# Patient Record
Sex: Male | Born: 1990 | Race: White | Hispanic: Yes | Marital: Single | Smoking: Never smoker
Health system: Southern US, Community
[De-identification: ages and names within clinical notes are randomized; demographics above are authoritative.]

## PROBLEM LIST (undated history)

## (undated) HISTORY — PX: TONSILLECTOMY: SUR1361

---

## 2008-11-14 ENCOUNTER — Ambulatory Visit: Payer: Self-pay | Admitting: Diagnostic Radiology

## 2008-11-14 ENCOUNTER — Emergency Department (HOSPITAL_BASED_OUTPATIENT_CLINIC_OR_DEPARTMENT_OTHER): Admission: EM | Admit: 2008-11-14 | Discharge: 2008-11-14 | Payer: Self-pay | Admitting: Emergency Medicine

## 2009-01-01 ENCOUNTER — Ambulatory Visit: Payer: Self-pay | Admitting: Diagnostic Radiology

## 2009-01-01 ENCOUNTER — Emergency Department (HOSPITAL_BASED_OUTPATIENT_CLINIC_OR_DEPARTMENT_OTHER): Admission: EM | Admit: 2009-01-01 | Discharge: 2009-01-01 | Payer: Self-pay | Admitting: Emergency Medicine

## 2009-07-12 ENCOUNTER — Emergency Department (HOSPITAL_BASED_OUTPATIENT_CLINIC_OR_DEPARTMENT_OTHER): Admission: EM | Admit: 2009-07-12 | Discharge: 2009-07-12 | Payer: Self-pay | Admitting: Emergency Medicine

## 2009-12-14 ENCOUNTER — Ambulatory Visit: Payer: Self-pay | Admitting: Diagnostic Radiology

## 2009-12-14 ENCOUNTER — Emergency Department (HOSPITAL_BASED_OUTPATIENT_CLINIC_OR_DEPARTMENT_OTHER): Admission: EM | Admit: 2009-12-14 | Discharge: 2009-12-14 | Payer: Self-pay | Admitting: Emergency Medicine

## 2010-01-23 ENCOUNTER — Emergency Department (HOSPITAL_BASED_OUTPATIENT_CLINIC_OR_DEPARTMENT_OTHER): Admission: EM | Admit: 2010-01-23 | Discharge: 2010-01-23 | Payer: Self-pay | Admitting: Emergency Medicine

## 2010-04-28 IMAGING — CR DG CHEST 2V
2 series · 2 of 2 positions shown · non-contrast
Comparison: None

CLINICAL DATA: Cough, back pain, headache.

CHEST - 2 VIEW

[w chest pa]
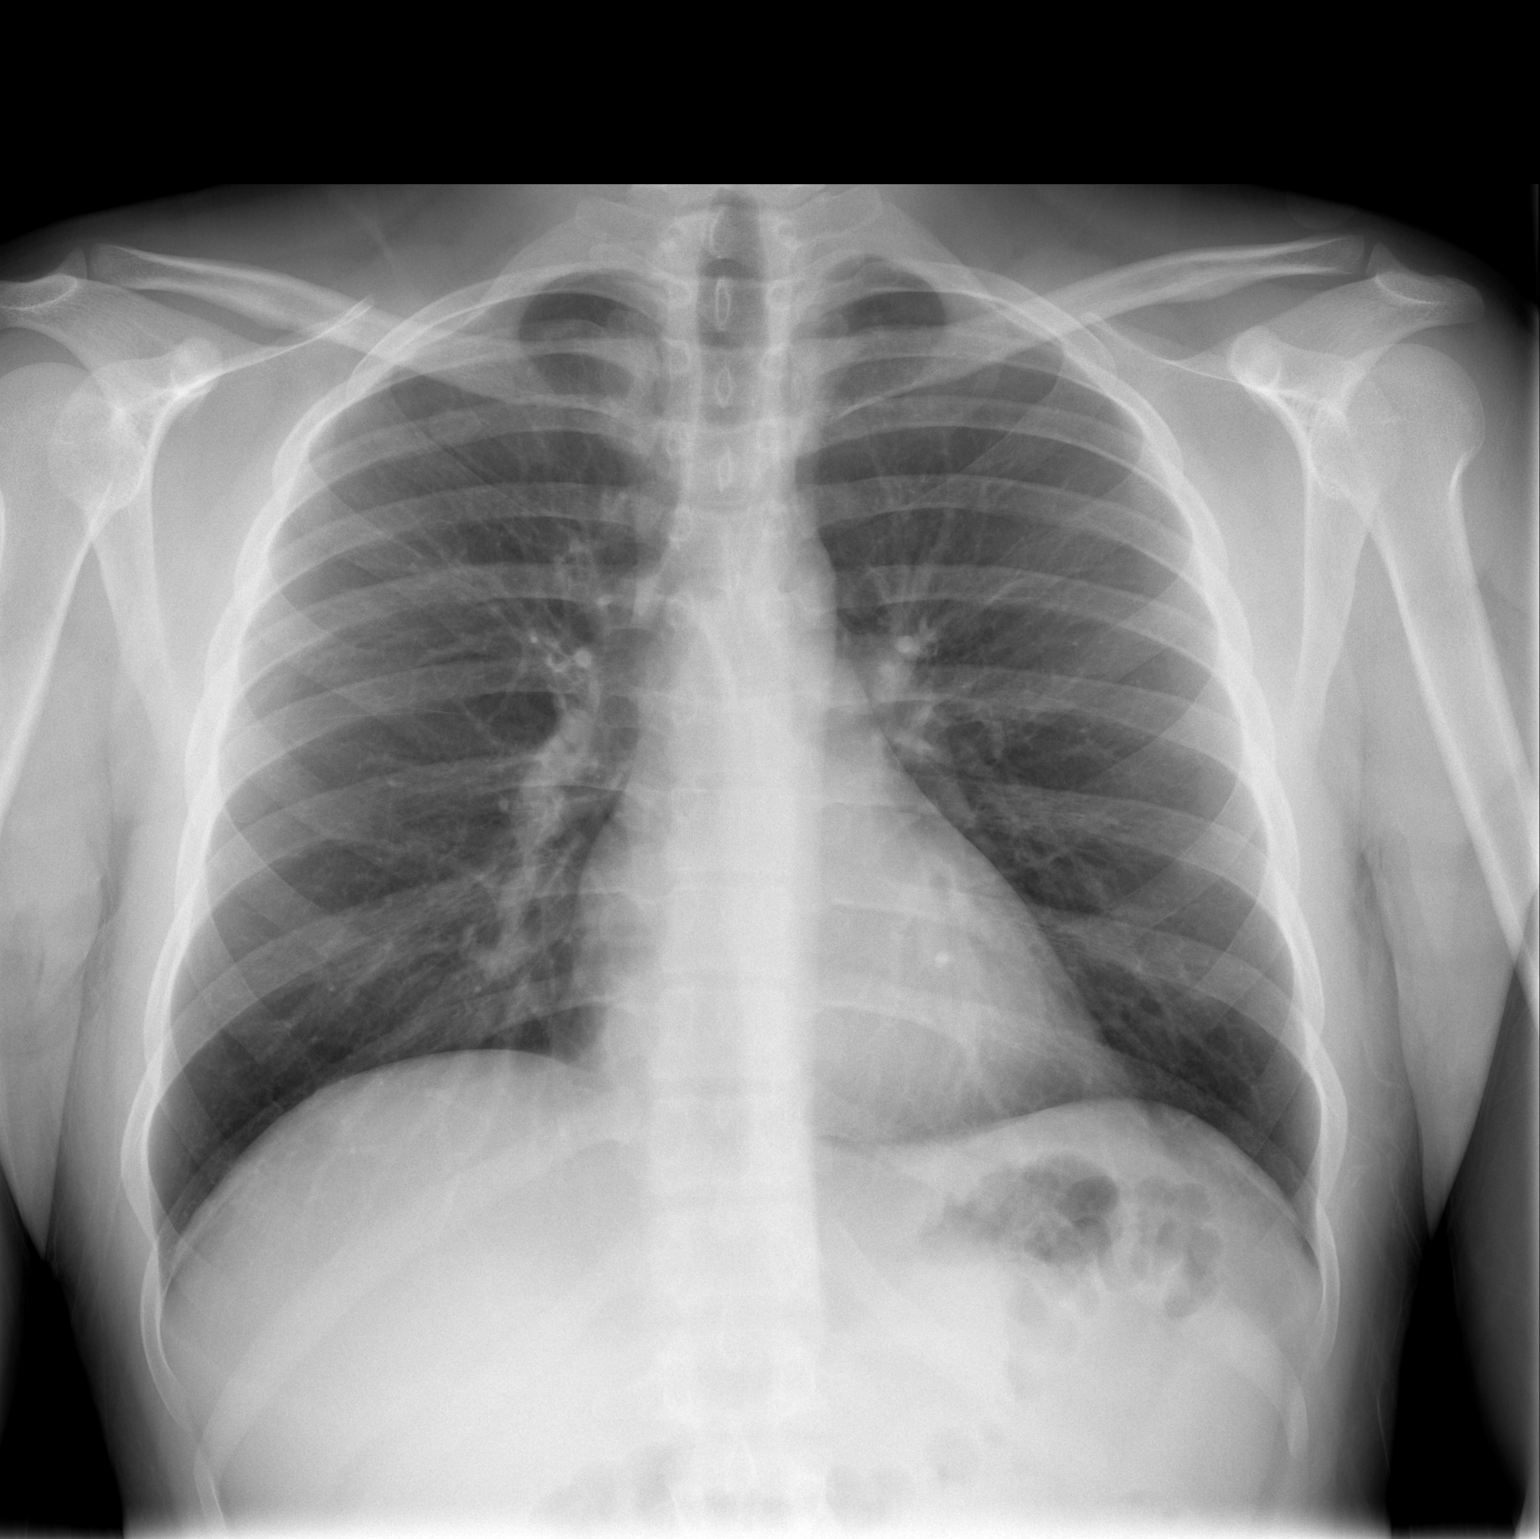

[w chest lat]
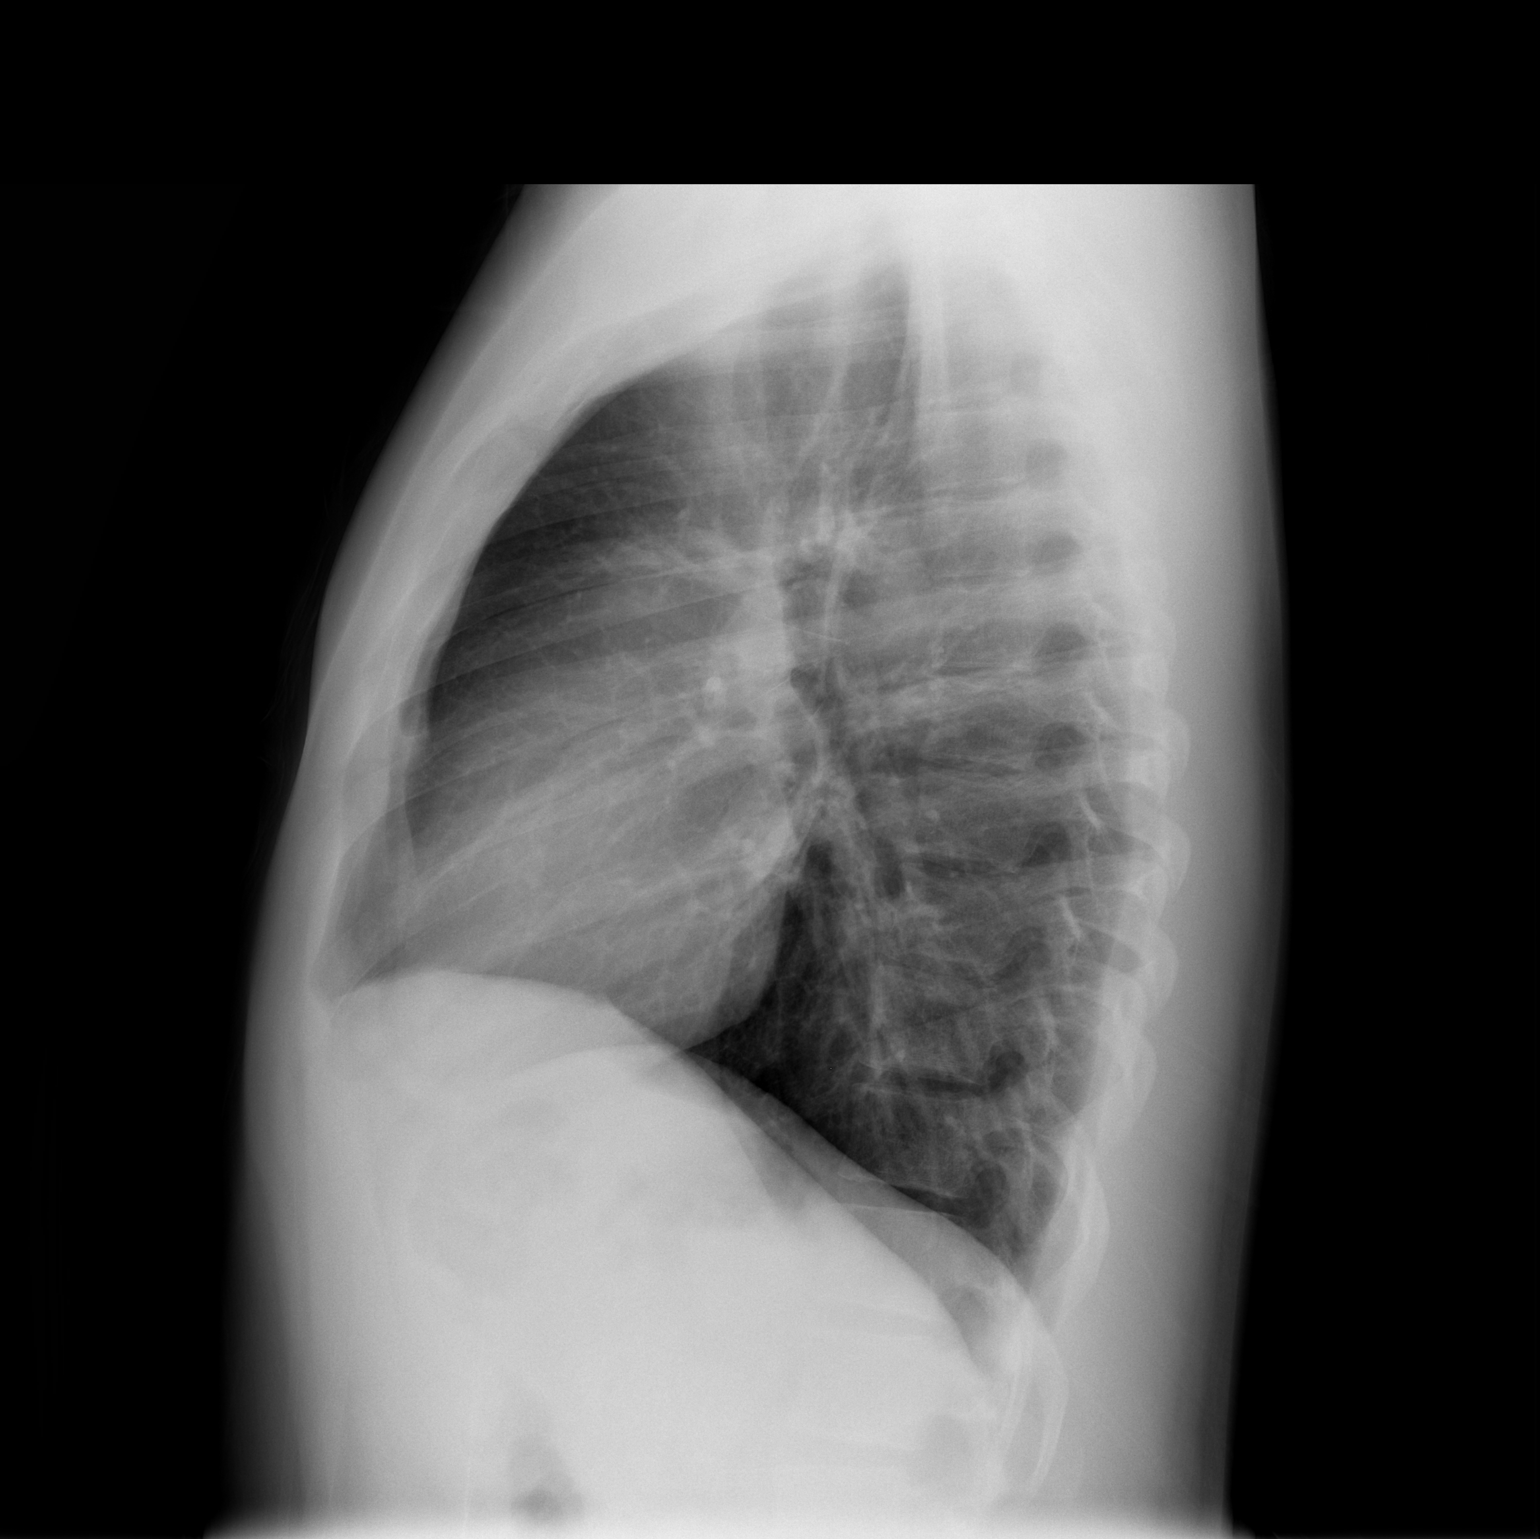

[2 of 2 positions shown; findings below may reference images not displayed]

FINDINGS: Heart and mediastinal contours are within normal limits.
No focal opacities or effusions.  No acute bony abnormality.
IMPRESSION: No active disease.

## 2010-12-13 LAB — URINALYSIS, ROUTINE W REFLEX MICROSCOPIC
Bilirubin Urine: NEGATIVE
Glucose, UA: NEGATIVE mg/dL
Ketones, ur: 15 mg/dL — AB
Specific Gravity, Urine: 1.027 (ref 1.005–1.030)
Urobilinogen, UA: 0.2 mg/dL (ref 0.0–1.0)

## 2010-12-21 LAB — URINALYSIS, ROUTINE W REFLEX MICROSCOPIC
Ketones, ur: NEGATIVE mg/dL
Nitrite: NEGATIVE
Protein, ur: NEGATIVE mg/dL

## 2010-12-21 LAB — GC/CHLAMYDIA PROBE AMP, GENITAL
Chlamydia, DNA Probe: POSITIVE — AB
GC Probe Amp, Genital: NEGATIVE

## 2015-09-05 ENCOUNTER — Ambulatory Visit: Payer: Self-pay

## 2015-09-05 ENCOUNTER — Emergency Department (HOSPITAL_BASED_OUTPATIENT_CLINIC_OR_DEPARTMENT_OTHER)
Admission: EM | Admit: 2015-09-05 | Discharge: 2015-09-05 | Disposition: A | Discharge status: Home / Self Care | Payer: Self-pay | Attending: Emergency Medicine | Admitting: Emergency Medicine

## 2015-09-05 ENCOUNTER — Encounter (HOSPITAL_BASED_OUTPATIENT_CLINIC_OR_DEPARTMENT_OTHER): Payer: Self-pay | Admitting: *Deleted

## 2015-09-05 ENCOUNTER — Emergency Department (HOSPITAL_BASED_OUTPATIENT_CLINIC_OR_DEPARTMENT_OTHER): Payer: Self-pay

## 2015-09-05 DIAGNOSIS — B9789 Other viral agents as the cause of diseases classified elsewhere: Secondary | ICD-10-CM

## 2015-09-05 DIAGNOSIS — J069 Acute upper respiratory infection, unspecified: Secondary | ICD-10-CM | POA: Insufficient documentation

## 2015-09-05 MED ORDER — BENZONATATE 100 MG PO CAPS: 200.0000 mg | ORAL_CAPSULE | Freq: Two times a day (BID) | ORAL | Status: DC | PRN

## 2015-09-05 MED ORDER — OXYMETAZOLINE HCL 0.05 % NA SOLN: 1.0000 | Freq: Two times a day (BID) | NASAL | Status: DC

## 2015-09-05 NOTE — ED Notes (Signed)
Pt c/o URI symptoms  x 6 days 

## 2015-09-05 NOTE — ED Provider Notes (Signed)
CSN: [id number]     Arrival date & time 09/05/15  1329 History   First MD Initiated Contact with Patient 09/05/15 1720     Chief Complaint  Patient presents with  . URI     (Consider location/radiation/quality/duration/timing/severity/associated sxs/prior Treatment) Patient is a 24 y.o. male presenting with URI.  URI Presenting symptoms: congestion and cough   Presenting symptoms: no fever, no rhinorrhea and no sore throat   Associated symptoms: no headaches and no wheezing    Patient is 24 year old male who presents to the ED with complaint of cough and congestion, onset 6 days. Patient reports he initially lost his voice and since then has had a productive cough, endorses having green/yellow sputum. Denies fever, chills, rhinorrhea, sore throat, difficulty breathing, wheezing, chest pain, abdominal pain, nausea, vomiting, diarrhea, headache. Denies any recent sick contacts. He states he has been taking his grandmother's Vicodin at home for pain without relief.  History reviewed. No pertinent past medical history. Past Surgical History  Procedure Laterality Date  . Tonsillectomy     History reviewed. No pertinent family history. Social History  Substance Use Topics  . Smoking status: Never Smoker   . Smokeless tobacco: None  . Alcohol Use: No    Review of Systems  Constitutional: Positive for chills. Negative for fever.  HENT: Positive for congestion. Negative for rhinorrhea, sinus pressure and sore throat.   Respiratory: Positive for cough. Negative for shortness of breath and wheezing.   Cardiovascular: Negative for chest pain.  Gastrointestinal: Negative for nausea, vomiting, abdominal pain and diarrhea.  Skin: Negative for rash.  Neurological: Negative for headaches.      Allergies  Review of patient's allergies indicates not on file.  Home Medications   Prior to Admission medications   Medication Sig Start Date End Date Taking? Authorizing Provider   benzonatate (TESSALON) 100 MG capsule Take 2 capsules (200 mg total) by mouth 2 (two) times daily as needed for cough. 09/05/15   Barrett HenleNicole Elizabeth Nadeau, PA-C  oxymetazoline (AFRIN NASAL SPRAY) 0.05 % nasal spray Place 1 spray into both nostrils 2 (two) times daily. 09/05/15   Satira SarkNicole Elizabeth Nadeau, PA-C   BP 122/75 mmHg  Pulse 84  Temp(Src) 98.8 F (37.1 C) (Oral)  Resp 18  Ht 5\' 9"  (1.753 m)  Wt 84.369 kg  BMI 27.45 kg/m2  SpO2 99% Physical Exam  Constitutional: He is oriented to person, place, and time. He appears well-developed and well-nourished.  HENT:  Head: Normocephalic and atraumatic.  Right Ear: Tympanic membrane normal.  Left Ear: Tympanic membrane normal.  Nose: Right sinus exhibits maxillary sinus tenderness. Right sinus exhibits no frontal sinus tenderness. Left sinus exhibits maxillary sinus tenderness. Left sinus exhibits no frontal sinus tenderness.  Mouth/Throat: Uvula is midline, oropharynx is clear and moist and mucous membranes are normal. No oropharyngeal exudate.  Eyes: Conjunctivae and EOM are normal. Pupils are equal, round, and reactive to light. Right eye exhibits no discharge. Left eye exhibits no discharge. No scleral icterus.  Neck: Normal range of motion. Neck supple.  Cardiovascular: Normal rate, regular rhythm, normal heart sounds and intact distal pulses.   Pulmonary/Chest: Effort normal and breath sounds normal. No respiratory distress. He has no wheezes. He has no rales. He exhibits no tenderness.  Abdominal: Soft. Bowel sounds are normal. He exhibits no distension.  Musculoskeletal: He exhibits no edema.  Lymphadenopathy:    He has no cervical adenopathy.  Neurological: He is alert and oriented to person, place, and time.  Skin: Skin  is warm and dry.  Nursing note and vitals reviewed.   ED Course  Procedures (including critical care time) Labs Review Labs Reviewed - No data to display  Imaging Review Dg Chest 2 View  09/05/2015   CLINICAL DATA:  Sore throat and shortness of breath. EXAM: CHEST  2 VIEW COMPARISON:  None. FINDINGS: Cardiomediastinal silhouette is normal. Mediastinal contours appear intact. There is no evidence of focal airspace consolidation, pleural effusion or pneumothorax. Osseous structures are without acute abnormality. Soft tissues are grossly normal. IMPRESSION: No active cardiopulmonary disease. Electronically Signed   By: Ted Mcalpine M.D.   On: 09/05/2015 18:07   I have personally reviewed and evaluated these images and lab results as part of my medical decision-making.  Filed Vitals:   09/05/15 1337  BP: 122/75  Pulse: 84  Temp: 98.8 F (37.1 C)  Resp: 18     MDM   Final diagnoses:  Viral URI with cough    Pt presents with worsening URI sxs. Denies any sick contacts. VSS. Exam revealed bilateral maxillary sinus TTP, lungs CTAB, remaining exam unremarkable. CXR negative. I suspect pt's sxs are likely due to viral URI. Plan to d/c pt home with antitussive and decongestant. Pt give resource guide to follow up with PCP.  Evaluation does not show pathology requring ongoing emergent intervention or admission. Pt is hemodynamically stable and mentating appropriately. Discussed findings/results and plan with patient/guardian, who agrees with plan. All questions answered. Return precautions discussed and outpatient follow up given.      Satira Sark [place], [place] 09/06/15 4098  Loren Racer, MD 09/14/15 [phone]

## 2015-09-05 NOTE — Discharge Instructions (Signed)
Take your medications as prescribed. I also recommend using a warm mist humidifier or taking a steam shower for symptomatic relief. He may also drink warm water with honey for cough suppression. Please follow up with a primary care provider from the Resource Guide provided below in 5 days. Please return to the Emergency Department if symptoms worsen or new onset of fever, headache, difficulty breathing, wheezing, chest pain.   Emergency Department Resource Guide 1) Find a Doctor and Pay Out of Pocket Although you won't have to find out who is covered by your insurance plan, it is a good idea to ask around and get recommendations. You will then need to call the office and see if the doctor you have chosen will accept you as a new patient and what types of options they offer for patients who are self-pay. Some doctors offer discounts or will set up payment plans for their patients who do not have insurance, but you will need to ask so you aren't surprised when you get to your appointment.  2) Contact Your Local Health Department Not all health departments have doctors that can see patients for sick visits, but many do, so it is worth a call to see if yours does. If you don't know where your local health department is, you can check in your phone book. The CDC also has a tool to help you locate your state's health department, and many state websites also have listings of all of their local health departments.  3) Find a Walk-in Clinic If your illness is not likely to be very severe or complicated, you may want to try a walk in clinic. These are popping up all over the country in pharmacies, drugstores, and shopping centers. They're usually staffed by nurse practitioners or physician assistants that have been trained to treat common illnesses and complaints. They're usually fairly quick and inexpensive. However, if you have serious medical issues or chronic medical problems, these are probably not your best  option.  No Primary Care Doctor: - Call Health Connect at  [phone] - they can help you locate a primary care doctor [phone] your insurance, provides certain services, etc. - Physician Referral Service- [phone]  Chronic Pain Problems: Organization         Address  Phone   Notes  [facility]  [phone] Patients need to be referred by their primary care doctor.   Medication Assistance: Organization         Address  Phone   Notes  [facility] Medication [facility] [address] [place] [place], [place] [address] [phone] --Must be a resident of [facility] -- Must have NO insurance coverage whatsoever (no Medicaid/ Medicare, etc.) -- The pt. MUST have a primary care doctor that directs their care regularly and follows them in the community   MedAssist  [phone]   [employer]  [phone]    Agencies that provide inexpensive medical care: Organization         Address  Phone   Notes  [facility]  [phone]   [facility]    [phone]   [facility] [address] [place], [place] [address] [phone]   [facility] [address] [place]. [address] [place] [phone]   Planned Parenthood    [phone]   [facility]    [phone]   Community [facility] and [facility]  [address], [place] Phone:  [phone], Fax:  [phone] Hours of Operation:  9 am - 6 pm, M-F.  Also accepts Medicaid/Medicare and self-pay.  [facility] for [place], [address], [place] Phone: [phone], Fax: [phone]. Hours of Operation:  8:30 am - 5:30 pm, M-F.  Also accepts Medicaid and self-pay.  [facility] [place] [address], [place] Phone: [phone]   [place], [place], [place] [phone], Ext. [phone] Mondays & Thursdays: 7-9 AM.  First 15  patients are seen on a first come, first serve basis.    [place] Providers:  Organization         Address  Phone   Notes  [facility] [address], [place] [phone] Also accepts self-pay patients.  [facility] [address], [place]  [phone]   [place], [address], [place] [phone]   [facility] [address] [place] [phone]   Lucianne Lei [address], [place]   [phone] Only accepts [place] Access [place] patients after they have their name applied to their card.   Self-Pay (no insurance) in [facility]:  Organization         Address  Phone   Notes  Sickle Cell Patients, [facility] [place] [phone]   [facility] [place] [phone]   [facility] [place]  [place], [place], [place] [phone]   [facility]/Dr. Osei-Bonsu  [address], [place] [address], [place] [phone] Phone number for both [place] and [place] locations is the same.  [facility]gent [facility] and [facility] [address] [place] [phone]   [facility] [address], [place] or [address] [phone]   [facility] [address] [place] [phone], phone; [phone], fax Sees patients 1st and 3rd Saturday of every month.  Must not qualify for public or private insurance (i.e. Medicaid, Medicare, [place] Health Choice, Veterans' Benefits)  Household income should be no more than 200% of the poverty level The clinic cannot treat you if you are pregnant or think you are pregnant  Sexually transmitted diseases are not treated at the clinic.    Dental  Care: Organization         Address  Phone  Notes  [facility] Department of [facility] [place] [phone] Accepts children up to age 61 who are enrolled in [place] or [employer]; pregnant women with a Medicaid card; and children who have applied for Medicaid or [place] Health Choice, but were declined, whose parents can pay a reduced fee at time of service.  [facility] Department of [facility]  [address], [place] [phone] Accepts children up to age 47 who are enrolled in [place] or [place]; pregnant women with a Medicaid card; and children who have applied for Medicaid or [place] Health Choice, but were declined, whose parents can pay a reduced fee at time of service.  [facility] Adult Dental Access PROGRAM  [place], [place] [phone]  Patients are seen by appointment only. Walk-ins are not accepted. [place] will see patients 1 years of age and older. Monday - Tuesday (8am-5pm) Most Wednesdays (8:30-5pm) $30 per visit, cash only  [facility] Adult Dental Access PROGRAM  [address], [facility] [phone] Patients are seen by appointment only. Walk-ins are not accepted. [place] will see patients 28 years of age and older. One Wednesday Evening (Monthly: Volunteer Based).  $30 per visit, cash only  [place]  [phone] for adults; Children under age 81, call Graduate Pediatric Dentistry at [phone]. Children aged 82-14, please call [phone] to request a pediatric application.  Dental services are provided in all areas of dental care including fillings, crowns and bridges, complete and partial dentures, implants, gum treatment, root canals, and extractions. Preventive care is also provided. Treatment is provided to both adults and children. Patients are selected via a lottery and there is often a waiting list.   [facility] [address] [place]  [phone] [email or web address]   [facility] [address] [place], [place] [phone], Ext. [phone] Second and Fourth Thursday of each month, opens at 6:30 AM; Clinic ends at 9 AM.  Patients are seen on a first-come first-served basis, and a limited number are seen during each clinic.   [facility]  [address] Hillard Danker [place], [place] [phone]   Eligibility Requirements You must have lived in [place], [place], or [place] counties for at least the last three months.   You cannot be eligible for state or federal sponsored [employer], including [employer], [place], or [employer].   You generally cannot be eligible for healthcare insurance through your employer.    How to apply: Eligibility screenings are held every Tuesday and Wednesday afternoon from 1:00 pm until 4:00 pm. You do not need an appointment for the interview!  [facility] [address] [place], [place]   [place]  [place] Department  [place]  [phone]    Behavioral Health Resources in the Community: Intensive Outpatient Programs Organization         Address  Phone  Notes  [place]. [address], [place], [place] [phone]   [facility] Outpatient [address] [place], [place]   ADS: Alcohol & Drug Svcs [address], [place], [place]   [place] [address],  [place], [place] or [phone]   Substance Abuse Resources Organization         Address  Phone  Notes  Alcohol and Drug Services  [phone]   [place]  [phone]   The [place]   Chinita Pester  [phone]   Residential & Outpatient Substance Abuse Program  [phone]    Psychological Services Organization         Address  Phone  Notes  [facility] [place]  [place]  [phone]   [place] [address], [place] or [phone]    Mobile Crisis Teams Organization         Address  Phone  Notes  [facility], Mobile Crisis Care Unit  [phone]   [facility]  [address] [employer], [place]   [facility] [address] [place]  [address] [place] [phone]    Self-Help/Support Groups Organization         Address  Phone             Notes  [facility] [place] - variety of support groups  336- [phone] Call for more information  Narcotics Anonymous (NA), [facility] [address], [place]  2 meetings at this location   Statisticianesidential Treatment Programs Organization         Address  Phone  Notes  [facility] [address],    [place]  [phone]   [facility]  [address], [place] [phone], [place], [place] [phone]   [facility] [address], [place] [phone] Admissions: 8am-3pm M-F  [facility] [address]    [place], [place] [phone]   [facility] [address], [place], [place] [phone]   [facility] [address]  [place], [place] [phone]   [facility] - Intensive Outpatient [address], [place], [place] [phone]   [facility] ([facility] [address]  [place], [place] [phone] or [phone]   [facility] (RTS) [address] [place], [place] [phone] Accepts Medicaid  [facility] [address]  [place] [phone] Substance Abuse/Addiction Treatment   [facility] Behavioral Health Resources Organization         Address  Phone  Notes  [facility]  [phone]   Angie FavaJulie Brannon, PhD [address], [address] [place], [place]   [phone] or [phone]) [phone]   [facility]   [address] [place], [place] [phone](336) [phone]   [facility] [address], [place], [place] [phone]) [phone] Insurance/Medicaid/sponsorship through [facility]  [facility] Families [address]                                    [place], [place] [phone]) [phone] Therapy/tele-psych/case  [facility] [address].   [place], [place] [phone]) [phone]    Dr. Lolly MustacheArfeen  [phone]   [facility]  [facility] 1) [address], [place] 2) [address], [place] 3)  [address], [place] [phone](336) [phone]  [phone]   [facility] Child Abuse Hotline [phone](336) [phone] or [phone](336) [phone] (After Hours)

## 2016-05-13 ENCOUNTER — Emergency Department (HOSPITAL_COMMUNITY)
Admission: EM | Admit: 2016-05-13 | Discharge: 2016-05-14 | Disposition: A | Discharge status: Home / Self Care | Payer: No Typology Code available for payment source | Attending: Emergency Medicine | Admitting: Emergency Medicine

## 2016-05-13 ENCOUNTER — Encounter (HOSPITAL_COMMUNITY): Payer: Self-pay

## 2016-05-13 DIAGNOSIS — Y92410 Unspecified street and highway as the place of occurrence of the external cause: Secondary | ICD-10-CM | POA: Diagnosis not present

## 2016-05-13 DIAGNOSIS — R0781 Pleurodynia: Secondary | ICD-10-CM | POA: Diagnosis not present

## 2016-05-13 DIAGNOSIS — Y999 Unspecified external cause status: Secondary | ICD-10-CM | POA: Insufficient documentation

## 2016-05-13 DIAGNOSIS — M25512 Pain in left shoulder: Secondary | ICD-10-CM

## 2016-05-13 DIAGNOSIS — S50812A Abrasion of left forearm, initial encounter: Secondary | ICD-10-CM | POA: Insufficient documentation

## 2016-05-13 DIAGNOSIS — Y9389 Activity, other specified: Secondary | ICD-10-CM | POA: Diagnosis not present

## 2016-05-13 DIAGNOSIS — M7918 Myalgia, other site: Secondary | ICD-10-CM

## 2016-05-13 DIAGNOSIS — S59912A Unspecified injury of left forearm, initial encounter: Secondary | ICD-10-CM | POA: Diagnosis present
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
## 2016-05-14 ENCOUNTER — Emergency Department (HOSPITAL_COMMUNITY): Payer: No Typology Code available for payment source

## 2016-05-14 MED ORDER — OXYCODONE-ACETAMINOPHEN 5-325 MG PO TABS
1.0000 | ORAL_TABLET | Freq: Once | ORAL | Status: AC
  Administered 2016-05-14: 1 via ORAL
  Filled 2016-05-14: qty 1

## 2016-05-14 MED ORDER — OXYCODONE-ACETAMINOPHEN 5-325 MG PO TABS: 2.0000 | ORAL_TABLET | Freq: Three times a day (TID) | ORAL | 0 refills | Status: DC | PRN

## 2016-05-14 NOTE — ED Notes (Signed)
Pt educated about incentive spirometry and fitted for sling.
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
## 2016-07-30 ENCOUNTER — Encounter (HOSPITAL_COMMUNITY): Payer: Self-pay

## 2016-07-30 ENCOUNTER — Emergency Department (HOSPITAL_COMMUNITY)
Admission: EM | Admit: 2016-07-30 | Discharge: 2016-07-30 | Disposition: A | Discharge status: Home / Self Care | Payer: Self-pay | Attending: Emergency Medicine | Admitting: Emergency Medicine

## 2016-07-30 DIAGNOSIS — H578 Other specified disorders of eye and adnexa: Secondary | ICD-10-CM | POA: Insufficient documentation

## 2016-07-30 DIAGNOSIS — H5789 Other specified disorders of eye and adnexa: Secondary | ICD-10-CM

## 2016-07-30 NOTE — ED Provider Notes (Signed)
[facility]-EMERGENCY DEPT Provider Note   CSN: [id number] Arrival date & time: 07/30/16  0757     History   Chief Complaint Chief Complaint  Patient presents with  . Eye Pain    HPI Britt Bottomdrian Velazquez-Alicea is a 25 y.o. male.  Patient is 25 yo M with no PMH, presenting with chief complaint of bump to right eyelid. States it has been present for about a month. It is not painful, getting bigger, or draining, but it irritated him last night and he decided to come in for evaluation. He has been trying hot compresses without relief. Denies vision changes.      History reviewed. No pertinent past medical history.  There are no active problems to display for this patient.   Past Surgical History:  Procedure Laterality Date  . TONSILLECTOMY         Home Medications    Prior to Admission medications   Medication Sig Start Date End Date Taking? Authorizing Provider  oxyCODONE-acetaminophen (PERCOCET/ROXICET) 5-325 MG tablet Take 2 tablets by mouth every 8 (eight) hours as needed for severe pain. 05/14/16   Audry Piliyler Mohr, PA-C    Family History History reviewed. No pertinent family history.  Social History Social History  Substance Use Topics  . Smoking status: Never Smoker  . Smokeless tobacco: Never Used  . Alcohol use Yes     Comment: socially     Allergies   Patient has no known allergies.   Review of Systems Review of Systems  Constitutional: Negative for fever.  Eyes: Negative for pain, discharge, itching and visual disturbance.  Skin: Negative for color change and rash.  Neurological: Negative for headaches.     Physical Exam Updated Vital Signs BP 125/76 (BP Location: Right Arm)   Pulse 91   Temp 98 F (36.7 C) (Oral)   Resp 17   SpO2 97%   Physical Exam  Constitutional: He appears well-developed and well-nourished. No distress.  HENT:  Head: Normocephalic and atraumatic.  Mouth/Throat: Oropharynx is clear and moist.  Eyes: Conjunctivae and EOM  are normal. Pupils are equal, round, and reactive to light.  1 mm white pustule noted to right eyelid. No involvement of eyelash. No erythema, drainage, or discharge noted.  Neck: Normal range of motion.  Cardiovascular: Normal rate.   Pulmonary/Chest: Effort normal. No respiratory distress.  Musculoskeletal: Normal range of motion.  Neurological: He is alert.  Skin: Skin is warm and dry.  Psychiatric: He has a normal mood and affect.  Nursing note and vitals reviewed.    ED Treatments / Results  Labs (all labs ordered are listed, but only abnormal results are displayed) Labs Reviewed - No data to display  EKG  EKG Interpretation None       Radiology No results found.  Procedures Procedures (including critical care time)  Medications Ordered in ED Medications - No data to display   Initial Impression / Assessment and Plan / ED Course  I have reviewed the triage vital signs and the nursing notes.  Pertinent labs & imaging results that were available during my care of the patient were reviewed by me and considered in my medical decision making (see chart for details).  Clinical Course    Patient is 25 yo M presenting with chief complaint of painless bump to right eyelid. On exam, 1 mm white pustule noted to right eyelid, with no involvement of eyelash or evidence of infection. Otherwise normal eye exam. No indication for I&D and advised patient to continue  hot compresses. Stable for d/c home with return precautions for new or worsening symptoms. Patient left before receiving AVS.  Final Clinical Impressions(s) / ED Diagnoses   Final diagnoses:  Eye irritation    New Prescriptions New Prescriptions   No medications on file     Jari PiggDaryl F de Villier II, [place]PA 07/31/16 1629    Nira ConnPedro Eduardo Cardama, MD 08/03/16 [phone]

## 2016-07-30 NOTE — ED Notes (Signed)
Patient is alert and oriented x3.  He was given DC instructions and follow up visit instructions.  Patient gave verbal understanding.  He was DC ambulatory under his own power to home.  V/S stable.  He was not showing any signs of distress on DC  Patient left before DC instructions could be given

## 2016-07-30 NOTE — ED Triage Notes (Signed)
Pt states bump in rt eye.  ? Stye.  Pt denies pain and discharge.  Has been going on for a while but worse since last night.  Denies visual changes.

## 2017-06-19 ENCOUNTER — Emergency Department (HOSPITAL_COMMUNITY): Admission: EM | Admit: 2017-06-19 | Discharge: 2017-06-19 | Discharge status: Against Medical Advice | Payer: Self-pay

## 2017-06-19 NOTE — ED Notes (Signed)
Called patient x 3 and no answer. 

## 2017-10-29 ENCOUNTER — Emergency Department (HOSPITAL_COMMUNITY)
Admission: EM | Admit: 2017-10-29 | Discharge: 2017-10-29 | Discharge status: Against Medical Advice | Payer: Self-pay | Attending: Emergency Medicine | Admitting: Emergency Medicine

## 2017-10-29 ENCOUNTER — Other Ambulatory Visit: Payer: Self-pay

## 2017-10-29 DIAGNOSIS — M545 Low back pain: Secondary | ICD-10-CM | POA: Insufficient documentation

## 2017-10-29 DIAGNOSIS — R1030 Lower abdominal pain, unspecified: Secondary | ICD-10-CM | POA: Insufficient documentation

## 2017-10-29 DIAGNOSIS — R3911 Hesitancy of micturition: Secondary | ICD-10-CM | POA: Insufficient documentation

## 2017-10-29 DIAGNOSIS — Z5321 Procedure and treatment not carried out due to patient leaving prior to being seen by health care provider: Secondary | ICD-10-CM | POA: Insufficient documentation

## 2017-10-29 LAB — CBC
HCT: 49.5 % (ref 39.0–52.0)
Hemoglobin: 17.3 g/dL — ABNORMAL HIGH (ref 13.0–17.0)
MCH: 29.5 pg (ref 26.0–34.0)
MCHC: 34.9 g/dL (ref 30.0–36.0)
MCV: 84.3 fL (ref 78.0–100.0)
PLATELETS: 249 10*3/uL (ref 150–400)
RBC: 5.87 MIL/uL — AB (ref 4.22–5.81)
RDW: 14.4 % (ref 11.5–15.5)
WBC: 18 10*3/uL — AB (ref 4.0–10.5)

## 2017-10-29 LAB — COMPREHENSIVE METABOLIC PANEL
ALK PHOS: 76 U/L (ref 38–126)
ALT: 42 U/L (ref 17–63)
AST: 27 U/L (ref 15–41)
Albumin: 4.6 g/dL (ref 3.5–5.0)
Anion gap: 11 (ref 5–15)
BILIRUBIN TOTAL: 1 mg/dL (ref 0.3–1.2)
BUN: 11 mg/dL (ref 6–20)
CALCIUM: 9.4 mg/dL (ref 8.9–10.3)
CHLORIDE: 102 mmol/L (ref 101–111)
CO2: 22 mmol/L (ref 22–32)
CREATININE: 0.74 mg/dL (ref 0.61–1.24)
Glucose, Bld: 108 mg/dL — ABNORMAL HIGH (ref 65–99)
Potassium: 3.9 mmol/L (ref 3.5–5.1)
Sodium: 135 mmol/L (ref 135–145)
TOTAL PROTEIN: 8.1 g/dL (ref 6.5–8.1)

## 2017-10-29 LAB — URINALYSIS, ROUTINE W REFLEX MICROSCOPIC
BILIRUBIN URINE: NEGATIVE
GLUCOSE, UA: NEGATIVE mg/dL
HGB URINE DIPSTICK: NEGATIVE
Ketones, ur: NEGATIVE mg/dL
Leukocytes, UA: NEGATIVE
Nitrite: NEGATIVE
PH: 5 (ref 5.0–8.0)
Protein, ur: NEGATIVE mg/dL
SPECIFIC GRAVITY, URINE: 1.023 (ref 1.005–1.030)

## 2017-10-29 LAB — LIPASE, BLOOD: LIPASE: 22 U/L (ref 11–51)

## 2017-10-29 NOTE — ED Triage Notes (Signed)
Pt states he normally has 3-4 BMs per day and urinated fine, No BM since 0200 and has taken laxative, tums and Pepto, has drank 6 bottles of water and still not able to urinate very much.

## 2017-10-30 ENCOUNTER — Emergency Department (HOSPITAL_COMMUNITY)
Admission: EM | Admit: 2017-10-30 | Discharge: 2017-10-30 | Disposition: A | Discharge status: Home / Self Care | Payer: Self-pay | Attending: Emergency Medicine | Admitting: Emergency Medicine

## 2017-10-30 ENCOUNTER — Emergency Department (HOSPITAL_COMMUNITY): Payer: Self-pay

## 2017-10-30 DIAGNOSIS — R109 Unspecified abdominal pain: Secondary | ICD-10-CM

## 2017-10-30 DIAGNOSIS — M545 Low back pain, unspecified: Secondary | ICD-10-CM

## 2017-10-30 DIAGNOSIS — R3911 Hesitancy of micturition: Secondary | ICD-10-CM

## 2017-10-30 MED ORDER — IBUPROFEN 600 MG PO TABS: 600.0000 mg | ORAL_TABLET | Freq: Three times a day (TID) | ORAL | 0 refills | Status: AC | PRN

## 2017-10-30 MED ORDER — TAMSULOSIN HCL 0.4 MG PO CAPS: 0.4000 mg | ORAL_CAPSULE | Freq: Every day | ORAL | 0 refills | Status: AC

## 2017-10-30 MED ORDER — KETOROLAC TROMETHAMINE 60 MG/2ML IM SOLN
60.0000 mg | Freq: Once | INTRAMUSCULAR | Status: AC
  Administered 2017-10-30: 60 mg via INTRAMUSCULAR
  Filled 2017-10-30: qty 2

## 2017-10-30 MED ORDER — OXYCODONE-ACETAMINOPHEN 5-325 MG PO TABS
1.0000 | ORAL_TABLET | Freq: Once | ORAL | Status: AC
  Administered 2017-10-30: 1 via ORAL
  Filled 2017-10-30: qty 1

## 2017-10-30 NOTE — ED Provider Notes (Signed)
[facility]-EMERGENCY DEPT Provider Note   CSN: [id number] Arrival date & time: 10/29/17  1926     History   Chief Complaint Chief Complaint  Patient presents with  . Constipation  . Urinary Retention    HPI Jeffrey Byrd is a 27 y.o. male.  HPI Patient is a 27 year old male who presents the emergency department with intermittent lower and left-sided abdominal pain now with worsening pain in his back over the past 24 hours.  Feels like he is having urinary flow issues.  No history of kidney stones.  Denies dysuria or urinary frequency.  He tried a laxative today as he has not had a bowel movement and feels like he may be slightly constipated.  Bedside bladder scan demonstrated 180 cc of urine.    No past medical history on file.  There are no active problems to display for this patient.   Past Surgical History:  Procedure Laterality Date  . TONSILLECTOMY         Home Medications    Prior to Admission medications   Medication Sig Start Date End Date Taking? Authorizing Provider  ibuprofen (ADVIL,MOTRIN) 600 MG tablet Take 1 tablet (600 mg total) by mouth every 8 (eight) hours as needed. 10/30/17   Azalia Bilis, MD    Family History No family history on file.  Social History Social History   Tobacco Use  . Smoking status: Never Smoker  . Smokeless tobacco: Never Used  Substance Use Topics  . Alcohol use: Yes    Comment: socially  . Drug use: Yes    Types: Marijuana    Comment: last smoked x30 minutes ago.     Allergies   Patient has no known allergies.   Review of Systems Review of Systems  All other systems reviewed and are negative.    Physical Exam Updated Vital Signs BP (!) 151/94 (BP Location: Left Arm)   Pulse 97   Temp 99.8 F (37.7 C) (Oral)   Resp 20   Ht 5\' 9"  (1.753 m)   Wt 90.7 kg (200 lb)   SpO2 98%   BMI 29.53 kg/m   Physical Exam  Constitutional: He is oriented to person, place, and time.  He appears well-developed and well-nourished.  HENT:  Head: Normocephalic and atraumatic.  Eyes: EOM are normal.  Neck: Normal range of motion.  Cardiovascular: Normal rate and regular rhythm.  Pulmonary/Chest: Effort normal and breath sounds normal.  Abdominal: Soft. He exhibits no distension. There is no tenderness.  Musculoskeletal: Normal range of motion.  Full range of motion of lower extremities.  No L-spine tenderness.  Paralumbar tenderness bilaterally without spasm.  Neurological: He is alert and oriented to person, place, and time.  Skin: Skin is warm and dry.  Psychiatric: He has a normal mood and affect. Judgment normal.  Nursing note and vitals reviewed.    ED Treatments / Results  Labs (all labs ordered are listed, but only abnormal results are displayed) Labs Reviewed  COMPREHENSIVE METABOLIC PANEL - Abnormal; Notable for the following components:      Result Value   Glucose, Bld 108 (*)    All other components within normal limits  CBC - Abnormal; Notable for the following components:   WBC 18.0 (*)    RBC 5.87 (*)    Hemoglobin 17.3 (*)    All other components within normal limits  URINALYSIS, ROUTINE W REFLEX MICROSCOPIC  LIPASE, BLOOD    EKG  EKG Interpretation None  Radiology Ct Renal Stone Study  Result Date: 10/30/2017 CLINICAL DATA:  Constipation difficulty urinating elevated white count EXAM: CT ABDOMEN AND PELVIS WITHOUT CONTRAST TECHNIQUE: Multidetector CT imaging of the abdomen and pelvis was performed following the standard protocol without IV contrast. COMPARISON:  None. FINDINGS: Lower chest: No acute abnormality. Small amount of right gynecomastia. Hepatobiliary: No focal liver abnormality is seen. No gallstones, gallbladder wall thickening, or biliary dilatation. Pancreas: Unremarkable. No pancreatic ductal dilatation or surrounding inflammatory changes. Spleen: Normal in size without focal abnormality. Adrenals/Urinary Tract: Adrenal  glands are unremarkable. Kidneys are normal, without renal calculi, focal lesion, or hydronephrosis. Bladder is unremarkable. Stomach/Bowel: Stomach is within normal limits. Appendix appears normal. No evidence of bowel wall thickening, distention, or inflammatory changes. Vascular/Lymphatic: No significant vascular findings are present. No enlarged abdominal or pelvic lymph nodes. Reproductive: Prostate is unremarkable. Other: Negative for free air or free fluid. Tiny fat in the umbilical region Musculoskeletal: No acute or significant osseous findings. IMPRESSION: Negative for nephrolithiasis, hydronephrosis or ureteral stone. No CT evidence for acute intra-abdominal or pelvic abnormality Electronically Signed   By: Jasmine PangKim  Fujinaga M.D.   On: 10/30/2017 01:46    Procedures Procedures (including critical care time)  Medications Ordered in ED Medications  ketorolac (TORADOL) injection 60 mg (60 mg Intramuscular Given 10/30/17 0159)  oxyCODONE-acetaminophen (PERCOCET/ROXICET) 5-325 MG per tablet 1 tablet (1 tablet Oral Given 10/30/17 0159)     Initial Impression / Assessment and Plan / ED Course  I have reviewed the triage vital signs and the nursing notes.  Pertinent labs & imaging results that were available during my care of the patient were reviewed by me and considered in my medical decision making (see chart for details).     Overall well-appearing.  Labs and urine without significant abnormality.  Leukocytosis of 18,000 noted of nonspecific significance.  CT imaging demonstrates no acute abnormalities.  Urology follow-up as needed.  Home with Flomax.  Primary care follow-up.  Final Clinical Impressions(s) / ED Diagnoses   Final diagnoses:  Acute abdominal pain  Acute low back pain without sciatica, unspecified back pain laterality    ED Discharge Orders        Ordered    ibuprofen (ADVIL,MOTRIN) 600 MG tablet  Every 8 hours PRN     10/30/17 [id number]       Azalia Bilisampos, Blannie Shedlock, MD 10/30/17  0206

## 2018-03-11 DIAGNOSIS — L02214 Cutaneous abscess of groin: Secondary | ICD-10-CM | POA: Diagnosis not present

## 2019-04-13 IMAGING — CT CT RENAL STONE PROTOCOL
2 of 4 series · 16 of 46 positions shown, 18 images · non-contrast
Comparison: None.

CLINICAL DATA: Constipation difficulty urinating elevated white
count

EXAM:
CT ABDOMEN AND PELVIS WITHOUT CONTRAST
TECHNIQUE: Multidetector CT imaging of the abdomen and pelvis was performed
following the standard protocol without IV contrast.

[Series 2: axial st · axial · 0.75mm/px · z∈[+1254,+1680]mm · 13 of 95 slices shown, 15 images]
[im 5/95  soft-tissue]
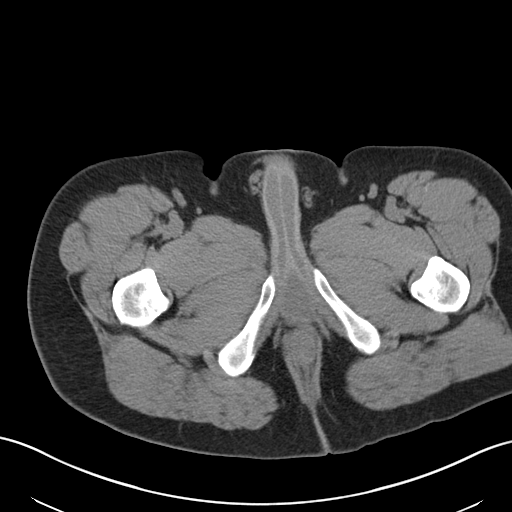
[im 5/95  bone]
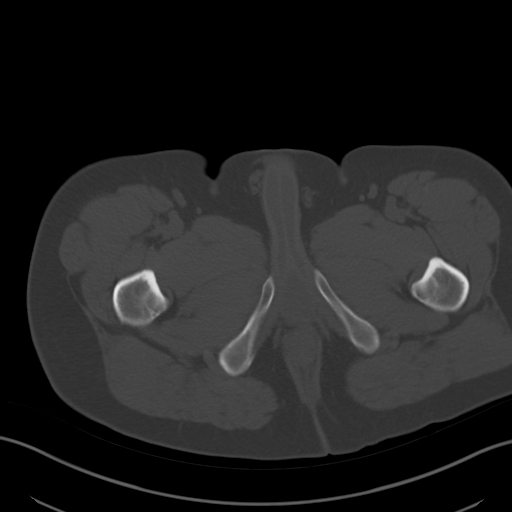
[im 15/95  soft-tissue]
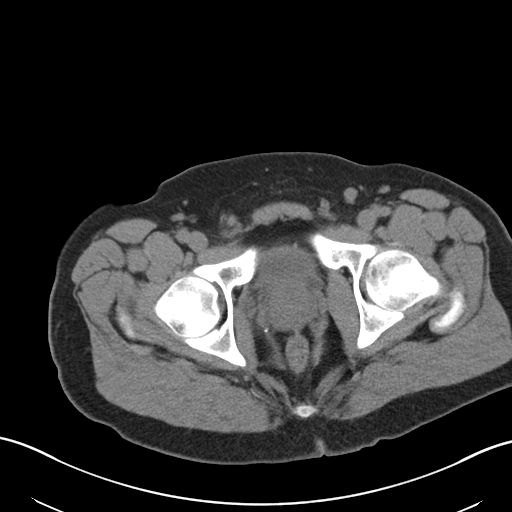
[im 19/95  soft-tissue]
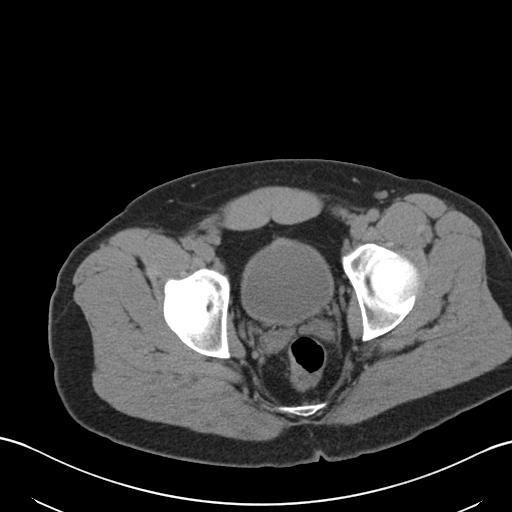
[im 29/95  soft-tissue]
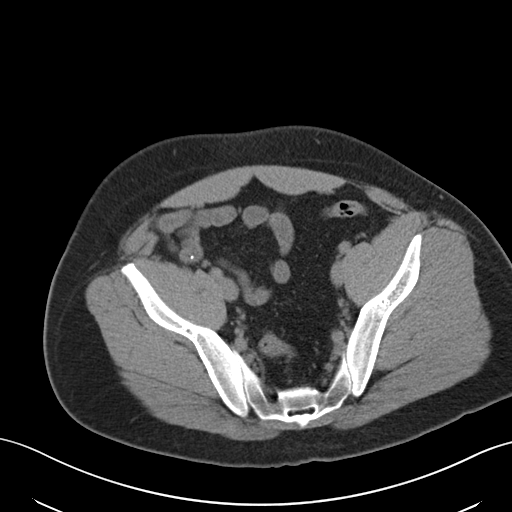
[im 33/95  soft-tissue]
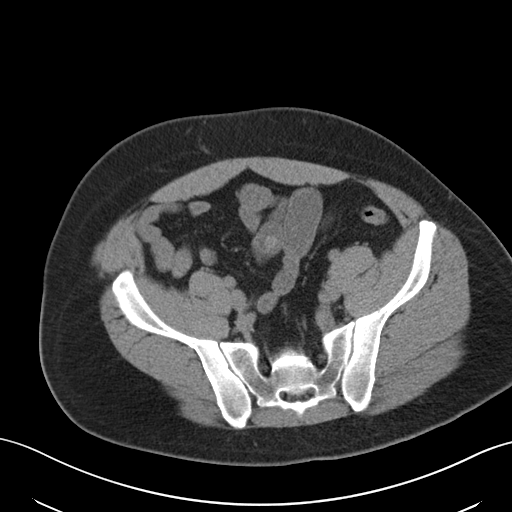
[im 43/95  soft-tissue]
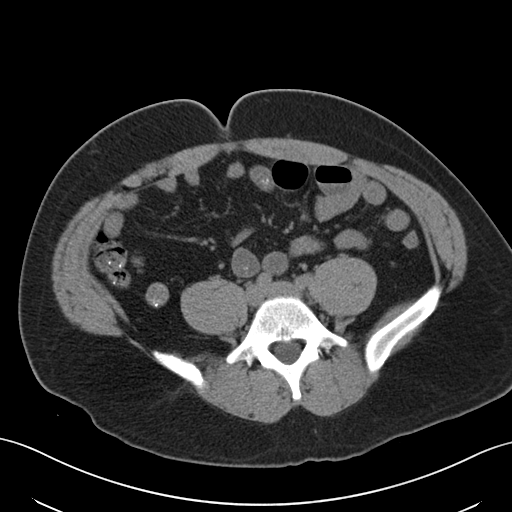
[im 48/95  soft-tissue]
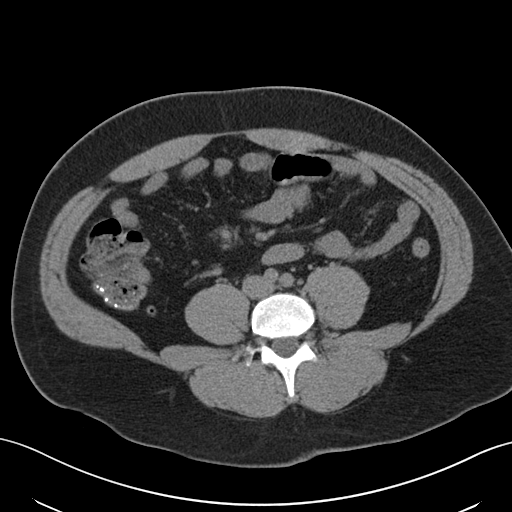
[im 52/95  soft-tissue]
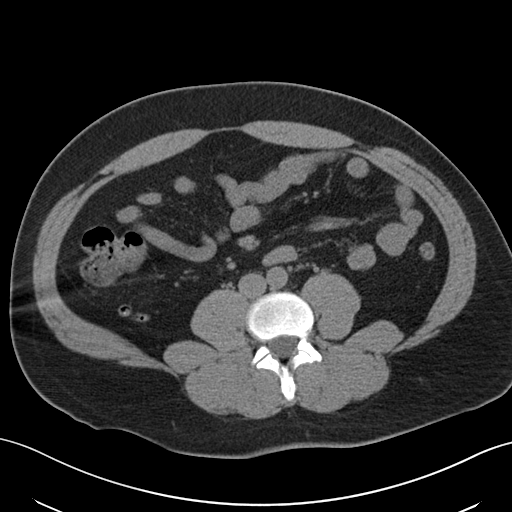
[im 62/95  soft-tissue]
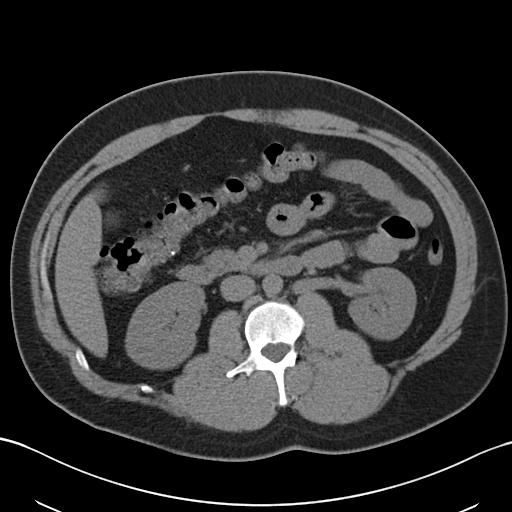
[im 62/95  bone]
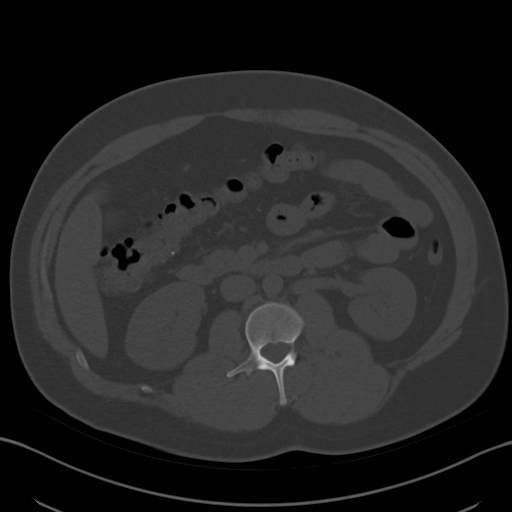
[im 66/95  soft-tissue]
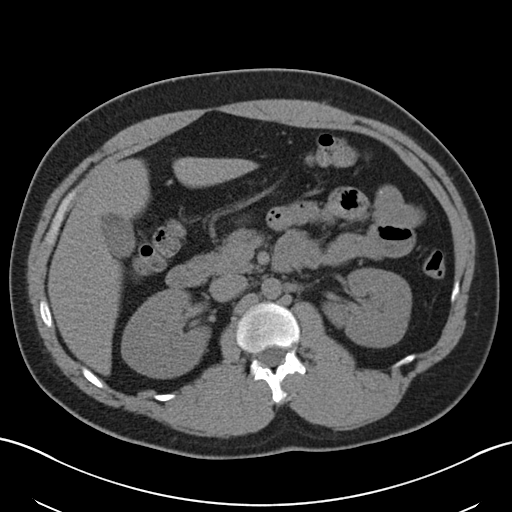
[im 76/95  soft-tissue]
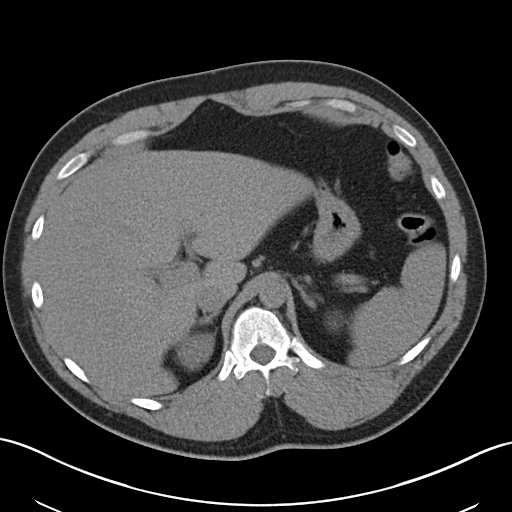
[im 80/95  soft-tissue]
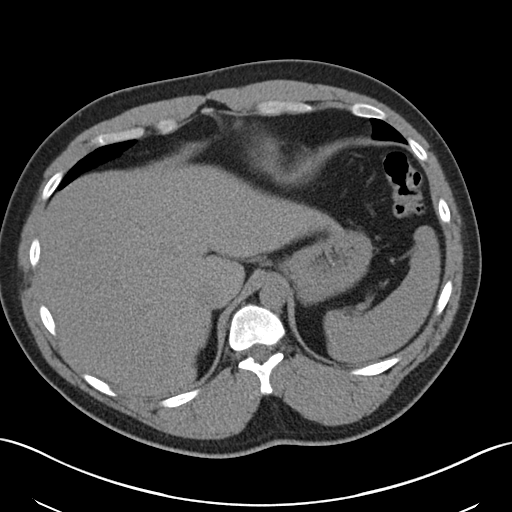
[im 90/95  soft-tissue]
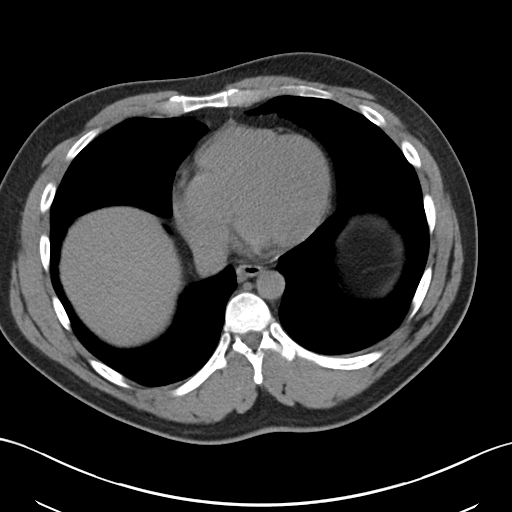

[Series 4: coronal · coronal · 0.72mm/px · 3 of 138 slices shown]
[im 46/138  soft-tissue]
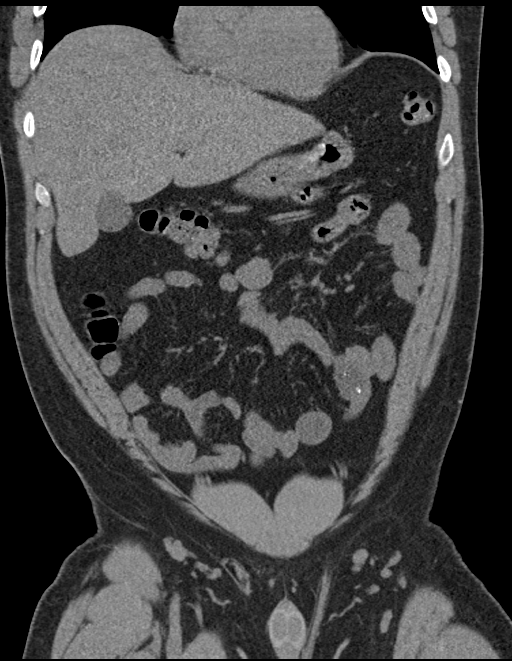
[im 61/138  soft-tissue]
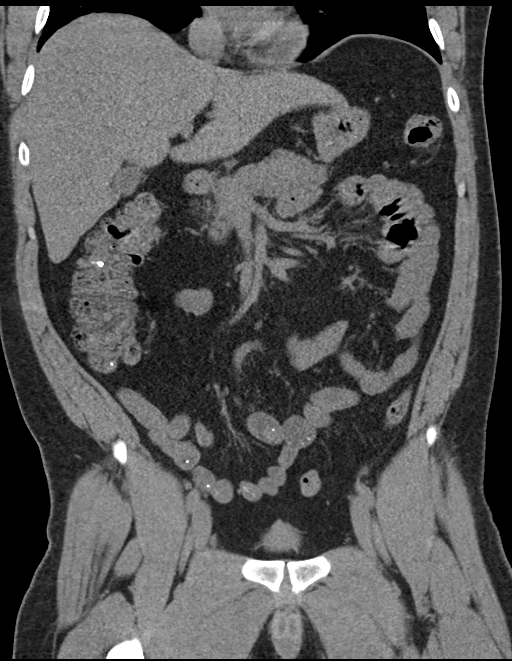
[im 77/138  soft-tissue]
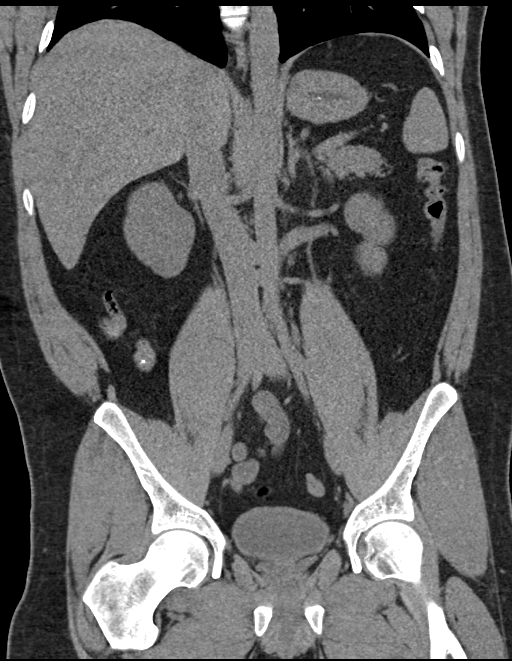

[16 of 46 positions shown; findings below may reference images not displayed]

FINDINGS: Lower chest: No acute abnormality. Small amount of right
gynecomastia.

Hepatobiliary: No focal liver abnormality is seen. No gallstones,
gallbladder wall thickening, or biliary dilatation.

Pancreas: Unremarkable. No pancreatic ductal dilatation or
surrounding inflammatory changes.

Spleen: Normal in size without focal abnormality.

Adrenals/Urinary Tract: Adrenal glands are unremarkable. Kidneys are
normal, without renal calculi, focal lesion, or hydronephrosis.
Bladder is unremarkable.

Stomach/Bowel: Stomach is within normal limits. Appendix appears
normal. No evidence of bowel wall thickening, distention, or
inflammatory changes.

Vascular/Lymphatic: No significant vascular findings are present. No
enlarged abdominal or pelvic lymph nodes.

Reproductive: Prostate is unremarkable.

Other: Negative for free air or free fluid. Tiny fat in the
umbilical region

Musculoskeletal: No acute or significant osseous findings.
IMPRESSION: Negative for nephrolithiasis, hydronephrosis or ureteral stone. No
CT evidence for acute intra-abdominal or pelvic abnormality

## 2019-05-14 DIAGNOSIS — M778 Other enthesopathies, not elsewhere classified: Secondary | ICD-10-CM | POA: Diagnosis not present

## 2020-08-15 DIAGNOSIS — R059 Cough, unspecified: Secondary | ICD-10-CM | POA: Diagnosis not present

## 2020-08-15 DIAGNOSIS — R0989 Other specified symptoms and signs involving the circulatory and respiratory systems: Secondary | ICD-10-CM | POA: Diagnosis not present

## 2020-08-15 DIAGNOSIS — J069 Acute upper respiratory infection, unspecified: Secondary | ICD-10-CM | POA: Diagnosis not present

## 2020-10-25 DIAGNOSIS — L0203 Carbuncle of face: Secondary | ICD-10-CM | POA: Diagnosis not present
